# Patient Record
Sex: Female | Born: 1955 | Race: White | Hispanic: No | Marital: Married | Smoking: Former smoker
Health system: Southern US, Community
[De-identification: ages and names within clinical notes are randomized; demographics above are authoritative.]

## PROBLEM LIST (undated history)

## (undated) DIAGNOSIS — E039 Hypothyroidism, unspecified: Secondary | ICD-10-CM

## (undated) HISTORY — DX: Hypothyroidism, unspecified: E03.9

## (undated) HISTORY — PX: CYST EXCISION: SHX5701

---

## 2005-06-05 ENCOUNTER — Other Ambulatory Visit: Admission: RE | Admit: 2005-06-05 | Discharge: 2005-06-05 | Payer: Self-pay | Admitting: Obstetrics and Gynecology

## 2005-07-08 ENCOUNTER — Other Ambulatory Visit: Admission: RE | Admit: 2005-07-08 | Discharge: 2005-07-08 | Payer: Self-pay | Admitting: Obstetrics and Gynecology

## 2005-07-10 ENCOUNTER — Encounter: Admission: RE | Admit: 2005-07-10 | Discharge: 2005-07-10 | Payer: Self-pay | Admitting: Obstetrics and Gynecology

## 2006-08-03 ENCOUNTER — Other Ambulatory Visit: Admission: RE | Admit: 2006-08-03 | Discharge: 2006-08-03 | Payer: Self-pay | Admitting: Obstetrics & Gynecology

## 2006-08-11 ENCOUNTER — Encounter: Admission: RE | Admit: 2006-08-11 | Discharge: 2006-08-11 | Payer: Self-pay | Admitting: Obstetrics & Gynecology

## 2007-09-14 ENCOUNTER — Encounter: Admission: RE | Admit: 2007-09-14 | Discharge: 2007-09-14 | Payer: Self-pay | Admitting: Obstetrics & Gynecology

## 2008-01-06 ENCOUNTER — Other Ambulatory Visit: Admission: RE | Admit: 2008-01-06 | Discharge: 2008-01-06 | Payer: Self-pay | Admitting: Obstetrics & Gynecology

## 2010-05-06 ENCOUNTER — Ambulatory Visit: Payer: Self-pay | Admitting: Emergency Medicine

## 2010-05-06 DIAGNOSIS — E039 Hypothyroidism, unspecified: Secondary | ICD-10-CM | POA: Insufficient documentation

## 2010-05-06 DIAGNOSIS — L03019 Cellulitis of unspecified finger: Secondary | ICD-10-CM

## 2010-05-06 DIAGNOSIS — L02519 Cutaneous abscess of unspecified hand: Secondary | ICD-10-CM | POA: Insufficient documentation

## 2010-05-09 ENCOUNTER — Telehealth (INDEPENDENT_AMBULATORY_CARE_PROVIDER_SITE_OTHER): Payer: Self-pay | Admitting: *Deleted

## 2010-08-19 ENCOUNTER — Encounter: Payer: Self-pay | Admitting: Obstetrics & Gynecology

## 2010-08-27 NOTE — Assessment & Plan Note (Signed)
Summary: L index finger puncture x 2 dys rm 5   Vital Signs:  Patient Profile:   55 Years Old Female CC:      L index finger wound x 2 dys Height:     66 inches Weight:      149 pounds O2 Sat:      100 % O2 treatment:    Room Air Temp:     98.0 degrees F oral Pulse rate:   63 / minute Pulse rhythm:   regular Resp:     16 per minute BP sitting:   141 / 85  (left arm) Cuff size:   regular  Vitals Entered By: Areta Haber CMA (May 06, 2010 6:48 PM)                  Current Allergies (reviewed today): ! CODEINE       History of Present Illness Chief Complaint: L index finger wound x 2 dys History of Present Illness: Swollen L index finger.  Cut herself yesterday with clean knife and now has a red swollen, mildly tender L index finger.  Last Rd was 2 yrs ago.  It is getting worse since yesterday so she decided to have it checked out.  Still can move it.  Isolated to finger.  No F/C/N/V or other symptoms.  Not using OTCs or doing anythign else.  Current Problems: FAMILY HISTORY OF COLON CA 1ST DEGREE RELATIVE <60 (ICD-V16.0) HYPOTHYROIDISM (ICD-244.9) CELLULITIS, FINGER (ICD-681.00)   Current Meds BACTRIM DS 800-160 MG TABS (SULFAMETHOXAZOLE-TRIMETHOPRIM) 1 tab by mouth two times a day for 10 days DOXYCYCLINE HYCLATE 100 MG CAPS (DOXYCYCLINE HYCLATE) 1 by mouth two times a day for 10 days LEVOTHYROXINE SODIUM 25 MCG TABS (LEVOTHYROXINE SODIUM) 1 tab by mouth once daily  REVIEW OF SYSTEMS Constitutional Symptoms      Denies fever, chills, night sweats, weight loss, weight gain, and fatigue.  Eyes       Denies change in vision, eye pain, eye discharge, glasses, contact lenses, and eye surgery. Ear/Nose/Throat/Mouth       Denies hearing loss/aids, change in hearing, ear pain, ear discharge, dizziness, frequent runny nose, frequent nose bleeds, sinus problems, sore throat, hoarseness, and tooth pain or bleeding.  Respiratory       Denies dry cough, productive  cough, wheezing, shortness of breath, asthma, bronchitis, and emphysema/COPD.  Cardiovascular       Denies murmurs, chest pain, and tires easily with exhertion.    Gastrointestinal       Denies stomach pain, nausea/vomiting, diarrhea, constipation, blood in bowel movements, and indigestion. Genitourniary       Denies painful urination, kidney stones, and loss of urinary control. Neurological       Denies paralysis, seizures, and fainting/blackouts. Musculoskeletal       Denies muscle pain, joint pain, joint stiffness, decreased range of motion, redness, swelling, muscle weakness, and gout.  Skin       Denies bruising, unusual mles/lumps or sores, and hair/skin or nail changes.  Psych       Denies mood changes, temper/anger issues, anxiety/stress, speech problems, depression, and sleep problems. Other Comments: Pt states she poked herself with a knife into her L index finger x 2 dys. Pt states she is up to date with her Tdap.   Past History:  Past Medical History: Hypothyroidism  Past Surgical History: Denies surgical history  Family History: Family History of Colon CA 1st degree relative <60 Family History Hypertension  Social History: Married Never  Smoked Alcohol use-no Drug use-no Regular exercise-yes Smoking Status:  never Drug Use:  no Does Patient Exercise:  yes Physical Exam General appearance: well developed, well nourished, no acute distress Heart: normal cap refill Neurological: normal Skin: see below MSE: oriented to time, place, and person L index finger with erythema and swelling most of digit.  DIP, PIP, and MCP all function normally.  No tenderness.  Very small 2mm laceration where she stabbed it with knife.  Erythema and swelling doesn't extend into hand. Assessment New Problems: FAMILY HISTORY OF COLON CA 1ST DEGREE RELATIVE <60 (ICD-V16.0) HYPOTHYROIDISM (ICD-244.9) CELLULITIS, FINGER (ICD-681.00)   Plan New Medications/Changes: DOXYCYCLINE  HYCLATE 100 MG CAPS (DOXYCYCLINE HYCLATE) 1 by mouth two times a day for 10 days  #20 x 0, 05/06/2010, Hoyt Koch MD BACTRIM DS 800-160 MG TABS (SULFAMETHOXAZOLE-TRIMETHOPRIM) 1 tab by mouth two times a day for 10 days  #20 x 0, 05/06/2010, Hoyt Koch MD  New Orders: New Patient Level II [phone] Planning Comments:   Take antibiotics as instructed Ice and elevate as much as possible If worsening pain or redness over the next 2-3 days rather than geting better, will need a referral to ortho/hand in case there is a closed-space infection   The patient and/or caregiver has been counseled thoroughly with regard to medications prescribed including dosage, schedule, interactions, rationale for use, and possible side effects and they verbalize understanding.  Diagnoses and expected course of recovery discussed and will return if not improved as expected or if the condition worsens. Patient and/or caregiver verbalized understanding.  Prescriptions: DOXYCYCLINE HYCLATE 100 MG CAPS (DOXYCYCLINE HYCLATE) 1 by mouth two times a day for 10 days  #20 x 0   Entered and Authorized by:   Hoyt Koch MD   Signed by:   Hoyt Koch MD on 05/06/2010   Method used:   Print then Give to Patient   RxID:   [id number] BACTRIM DS 800-160 MG TABS (SULFAMETHOXAZOLE-TRIMETHOPRIM) 1 tab by mouth two times a day for 10 days  #20 x 0   Entered and Authorized by:   Hoyt Koch MD   Signed by:   Hoyt Koch MD on 05/06/2010   Method used:   Print then Give to Patient   RxID:   [id number]   Orders Added: 1)  New Patient Level II [03500]

## 2010-08-27 NOTE — Progress Notes (Signed)
  Phone Note Outgoing Call Call back at [facility] Phone [phone]   Call placed by: Lajean Saver RN,  May 09, 2010 12:32 PM Call placed to: Patient Summary of Call: Call back: No answer. Message left on patient's personal cell phone with reason for call and call back if her finger is not any better. We will refer her to ortho.

## 2010-10-01 ENCOUNTER — Other Ambulatory Visit: Payer: Self-pay | Admitting: Obstetrics & Gynecology

## 2010-10-01 DIAGNOSIS — Z1231 Encounter for screening mammogram for malignant neoplasm of breast: Secondary | ICD-10-CM

## 2010-10-22 ENCOUNTER — Ambulatory Visit: Payer: Self-pay

## 2014-02-01 ENCOUNTER — Encounter: Payer: Self-pay | Admitting: Gynecology

## 2014-04-04 ENCOUNTER — Encounter: Payer: Self-pay | Admitting: Gynecology

## 2014-04-04 ENCOUNTER — Telehealth: Payer: Self-pay | Admitting: Gynecology

## 2014-04-04 NOTE — Telephone Encounter (Signed)
Left message regarding appointment needs to be rescheduled.

## 2014-04-10 ENCOUNTER — Encounter: Payer: Self-pay | Admitting: Gynecology

## 2014-04-11 ENCOUNTER — Encounter: Payer: Self-pay | Admitting: Gynecology

## 2014-05-02 ENCOUNTER — Encounter: Payer: Self-pay | Admitting: Gynecology

## 2014-05-12 ENCOUNTER — Encounter: Payer: Self-pay | Admitting: Obstetrics & Gynecology

## 2014-05-15 ENCOUNTER — Ambulatory Visit (INDEPENDENT_AMBULATORY_CARE_PROVIDER_SITE_OTHER): Payer: 59 | Admitting: Gynecology

## 2014-05-15 ENCOUNTER — Encounter: Payer: Self-pay | Admitting: Gynecology

## 2014-05-15 VITALS — BP 100/70 | HR 72 | Resp 16 | Ht 64.5 in | Wt 149.0 lb

## 2014-05-15 DIAGNOSIS — Z124 Encounter for screening for malignant neoplasm of cervix: Secondary | ICD-10-CM

## 2014-05-15 DIAGNOSIS — Z01419 Encounter for gynecological examination (general) (routine) without abnormal findings: Secondary | ICD-10-CM

## 2014-05-15 NOTE — Progress Notes (Signed)
58 y.o. Married Caucasian female   [id number] here for annual exam. Pt reports menses are absent due to Menopause. She occasionally report hot flashes, occasionally have night sweats, does not have vaginal dryness.  She is not using lubricants.  She does not report post-menopasual bleeding.  No dyspareunia.    Patient's last menstrual period was 07/28/2009.          Sexually active: Yes.    The current method of family planning is menopasue Exercising: Yes.    yoga, walking Last pap: 10/01/10 Neg Abnormal PAP:  no Mammogram: 09/17/2007 Bi-Rads 1 BSE: no  Colonoscopy: 2007 per patient  DEXA: never had one  Alcohol: 1-2 drinks/month  Tobacco: no  Labs: Juline Patchichard Pang, MD  Health Maintenance  Topic Date Due  . Pap Smear  04/19/1974  . Tetanus/tdap  04/20/1975  . Colonoscopy  04/19/2006  . Mammogram  09/13/2009  . Influenza Vaccine  02/25/2014    Family History  Problem Relation Age of Onset  . Colon cancer Father   . Hypertension Mother     Patient Active Problem List   Diagnosis Date Noted  . HYPOTHYROIDISM 05/06/2010  . CELLULITIS, FINGER 05/06/2010    Past Medical History  Diagnosis Date  . Hypothyroidism     Past Surgical History  Procedure Laterality Date  . Cyst excision  20 years ago     Under arm     Allergies: Codeine  Current Outpatient Prescriptions  Medication Sig Dispense Refill  . Cholecalciferol (VITAMIN D) 2000 UNITS CAPS Take by mouth.      . levothyroxine (SYNTHROID, LEVOTHROID) 25 MCG tablet Take 25 mcg by mouth daily before breakfast.       No current facility-administered medications for this visit.    ROS: Pertinent items are noted in HPI.  Exam:    BP 100/70  Pulse 72  Resp 16  Ht 5' 4.5" (1.638 m)  Wt 149 lb (67.586 kg)  BMI 25.19 kg/m2  LMP 07/28/2009 Weight change: @WEIGHTCHANGE @ Last 3 height recordings:  Ht Readings from Last 3 Encounters:  05/15/14 5' 4.5" (1.638 m)  05/06/10 5\' 6"  (1.676 m)   General appearance: alert,  cooperative and appears stated age Head: Normocephalic, without obvious abnormality, atraumatic Neck: no adenopathy, no carotid bruit, no JVD, supple, symmetrical, trachea midline and thyroid not enlarged, symmetric, no tenderness/mass/nodules Lungs: clear to auscultation bilaterally Breasts: Inspection negative, No nipple retraction or dimpling, No nipple discharge or bleeding, No axillary or supraclavicular adenopathy Heart: regular rate and rhythm, S1, S2 normal, no murmur, click, rub or gallop Abdomen: soft, non-tender; bowel sounds normal; no masses,  no organomegaly Extremities: extremities normal, atraumatic, no cyanosis or edema Skin: Skin color, texture, turgor normal. No rashes or lesions Lymph nodes: Cervical, supraclavicular, and axillary nodes normal. no inguinal nodes palpated Neurologic: Grossly normal   Pelvic: External genitalia:  no lesions              Urethra: normal appearing urethra with no masses, tenderness or lesions              Bartholins and Skenes: Bartholin's, Urethra, Skene's normal                 Vagina: normal appearing vagina with normal color and discharge, no lesions              Cervix: normal appearance and nabothian cysts              Pap taken: Yes.  Bimanual Exam:  Uterus:  uterus is normal size, shape, consistency and nontender                                      Adnexa:    normal adnexa in size, nontender and no masses                                      Rectovaginal: Confirms                                      Anus:  normal sphincter tone, no lesions     1. Encounter for routine gynecological examination  mammogram pap smear counseled on breast self exam, mammography screening, menopause, osteoporosis, adequate intake of calcium and vitamin D, diet and exercise return annually or prn Discussed PAP guideline changes, importance of weight bearing exercises, calcium, vit D and balanced diet.  2. Screening for cervical  cancer Guidelines reviewd - Pap Test with HP (IPS)  An After Visit Summary was printed and given to the patient.

## 2014-05-17 LAB — IPS PAP TEST WITH HPV

## 2014-05-29 ENCOUNTER — Encounter: Payer: Self-pay | Admitting: Gynecology

## 2015-04-26 ENCOUNTER — Other Ambulatory Visit: Payer: Self-pay | Admitting: Physical Medicine and Rehabilitation

## 2015-04-26 DIAGNOSIS — Z1239 Encounter for other screening for malignant neoplasm of breast: Secondary | ICD-10-CM

## 2015-05-09 ENCOUNTER — Ambulatory Visit: Payer: Self-pay

## 2015-05-17 ENCOUNTER — Ambulatory Visit (INDEPENDENT_AMBULATORY_CARE_PROVIDER_SITE_OTHER): Payer: 59

## 2015-05-17 DIAGNOSIS — Z1239 Encounter for other screening for malignant neoplasm of breast: Secondary | ICD-10-CM

## 2015-05-17 DIAGNOSIS — Z1231 Encounter for screening mammogram for malignant neoplasm of breast: Secondary | ICD-10-CM

## 2015-08-23 ENCOUNTER — Encounter: Payer: Self-pay | Admitting: Obstetrics & Gynecology

## 2015-08-23 ENCOUNTER — Ambulatory Visit (INDEPENDENT_AMBULATORY_CARE_PROVIDER_SITE_OTHER): Payer: 59 | Admitting: Obstetrics & Gynecology

## 2015-08-23 VITALS — BP 120/78 | HR 76 | Resp 14 | Ht 64.5 in | Wt 147.0 lb

## 2015-08-23 DIAGNOSIS — Z124 Encounter for screening for malignant neoplasm of cervix: Secondary | ICD-10-CM | POA: Diagnosis not present

## 2015-08-23 DIAGNOSIS — Z01419 Encounter for gynecological examination (general) (routine) without abnormal findings: Secondary | ICD-10-CM

## 2015-08-23 DIAGNOSIS — Z800 Family history of malignant neoplasm of digestive organs: Secondary | ICD-10-CM

## 2015-08-23 DIAGNOSIS — E2839 Other primary ovarian failure: Secondary | ICD-10-CM

## 2015-08-23 NOTE — Progress Notes (Signed)
60 y.o. [id number] MarriedCaucasianF here for annual exam.  Doing well.  No vaginal bleeding.    PCP:  Dr. Selena Batten.    Patient's last menstrual period was 07/28/2009.          Sexually active: Yes.    The current method of family planning is post menopausal status.    Exercising: Yes.    Isogenics Smoker:  no  Health Maintenance: Pap:  05/15/14 Neg. HR HPV:neg  History of abnormal Pap:  no MMG:  05/18/15 BIRADS1:Neg Colonoscopy:  2007 normal - repeat 5 years. Family hx.  BMD:   Never TDaP:  w/ PCP Screening Labs: PCP, Hb today: PCP, Urine today: PCP   reports that she quit smoking about 32 years ago. She has never used smokeless tobacco. She reports that she drinks alcohol. She reports that she does not use illicit drugs.  Past Medical History  Diagnosis Date  . Hypothyroidism     Past Surgical History  Procedure Laterality Date  . Cyst excision  20 years ago     Under arm     Current Outpatient Prescriptions  Medication Sig Dispense Refill  . Cholecalciferol (VITAMIN D) 2000 UNITS CAPS Take by mouth.    . levothyroxine (SYNTHROID, LEVOTHROID) 25 MCG tablet Take 25 mcg by mouth daily before breakfast.     No current facility-administered medications for this visit.    Family History  Problem Relation Age of Onset  . Colon cancer Father   . Hypertension Mother   . Bell's palsy Mother     ROS:  Pertinent items are noted in HPI.  Otherwise, a comprehensive ROS was negative.  Exam:   BP 120/78 mmHg  Pulse 76  Resp 14  Ht 5' 4.5" (1.638 m)  Wt 147 lb (66.679 kg)  BMI 24.85 kg/m2  LMP 07/28/2009  Weight change: +2#  Height: 5' 4.5" (163.8 cm)  Ht Readings from Last 3 Encounters:  08/23/15 5' 4.5" (1.638 m)  05/15/14 5' 4.5" (1.638 m)  05/06/10  (1.676 m)    General appearance: alert, cooperative and appears stated age Head: Normocephalic, without obvious abnormality, atraumatic Neck: no adenopathy, supple, symmetrical, trachea midline and thyroid normal to  inspection and palpation Lungs: clear to auscultation bilaterally Breasts: normal appearance, no masses or tenderness Heart: regular rate and rhythm Abdomen: soft, non-tender; bowel sounds normal; no masses,  no organomegaly Extremities: extremities normal, atraumatic, no cyanosis or edema Skin: Skin color, texture, turgor normal. No rashes or lesions Lymph nodes: Cervical, supraclavicular, and axillary nodes normal. No abnormal inguinal nodes palpated Neurologic: Grossly normal   Pelvic: External genitalia:  no lesions              Urethra:  normal appearing urethra with no masses, tenderness or lesions              Bartholins and Skenes: normal                 Vagina: normal appearing vagina with normal color and discharge, no lesions              Cervix: no lesions              Pap taken: Yes.   Bimanual Exam:  Uterus:  normal size, contour, position, consistency, mobility, non-tender              Adnexa: normal adnexa and no mass, fullness, tenderness               Rectovaginal:  Confirms               Anus:  normal sphincter tone, no lesions  Chaperone was present for exam.  A:  Well Woman with normal exam PMP, no HRT Family hx of colon cancer in her father Hypothyroidism  P:   Mammogram yearly pap smear today.  Neg pap with neg  HR HPV 10/15 Referral for colonoscopy Labs and vaccines with Dr. Selena Batten return annually or prn

## 2015-08-23 NOTE — Patient Instructions (Signed)
Please schedule your bone density with your mammogram in October.

## 2015-08-24 LAB — IPS PAP TEST WITH REFLEX TO HPV

## 2015-09-06 ENCOUNTER — Telehealth: Payer: Self-pay | Admitting: Obstetrics & Gynecology

## 2015-09-06 NOTE — Telephone Encounter (Signed)
Spoke with patient regarding referral to Dr Deniece Portela in [facility], this office had been trying to reach the patient to schedule.  I provided patient with Dr Tiburcio Pea' phone number. Patient agreeable to call and schedule appointment

## 2015-10-08 DIAGNOSIS — K635 Polyp of colon: Secondary | ICD-10-CM | POA: Insufficient documentation

## 2016-02-26 ENCOUNTER — Telehealth: Payer: Self-pay | Admitting: Obstetrics & Gynecology

## 2016-02-26 NOTE — Telephone Encounter (Signed)
Patient's sister was just diagnosed  with breast cancer and is wondering what this might mean for her. Patient said her father had two relatives with breast cancer also.

## 2016-02-27 NOTE — Telephone Encounter (Signed)
Routing to Dr. Hyacinth Meeker to review and advise. Office visit to discuss? Patient last screening mammogram 04/2015-Cat B Breast Density.

## 2016-02-27 NOTE — Telephone Encounter (Signed)
OV is a good idea to discuss genetic testing.  It will be helpful if she has details of sister's diagnosis.  Type of cancer, hormone positive/negative testing, her 2 nu testing?  Thanks.

## 2016-02-28 NOTE — Telephone Encounter (Signed)
Call to patient.  She requests office visit in 03/2016.  She will gather information and call back with any concerns.   Routing to provider for final review. Patient agreeable to disposition. Will close encounter.

## 2016-04-11 ENCOUNTER — Ambulatory Visit (INDEPENDENT_AMBULATORY_CARE_PROVIDER_SITE_OTHER): Payer: 59 | Admitting: Obstetrics & Gynecology

## 2016-04-11 ENCOUNTER — Encounter: Payer: Self-pay | Admitting: Obstetrics & Gynecology

## 2016-04-11 DIAGNOSIS — Z803 Family history of malignant neoplasm of breast: Secondary | ICD-10-CM | POA: Insufficient documentation

## 2016-04-11 NOTE — Progress Notes (Signed)
GYNECOLOGY  VISIT   HPI: 60 y.o. [id number] Married Caucasian female with strong family hx of breast cancer.  Sister just diagnosed with breast cancer.  She reports her sister is not very forthcoming with information so pt has had to glean a lot of the information about this in bits and pieces.  Sister did not Pt's sister is in her 6470's.  She also has paternal aunts that were both PMP.  Her father was diagnosed with colon cancer in his 5470's.  No melanoma hx.  No pancreatic hx and no endometrial hx.    D/W pt genetic testing typically done through [facility] and with medical genetics via appt.  D/w pt implications of test results including possible increased screening with yearly MRI, possible surgery including prophylactic mastectomy, possible prophylactic medication treatment as well.  Understands genetics testing may have implications for ovary removal as well and she may benefit from seeing oncologist as well.  She voices clear understanding and desires "to think about this".    Pt and I discussed 3D MMG with future mammography.  Questions answered.  Pt will plan to proceed with this in October.  Patient Active Problem List   Diagnosis Date Noted  . HYPOTHYROIDISM 05/06/2010  . CELLULITIS, FINGER 05/06/2010    Past Medical History:  Diagnosis Date  . Hypothyroidism     Past Surgical History:  Procedure Laterality Date  . CYST EXCISION  20 years ago    Under arm    MEDS:  Reviewed in EPIC and UTD  ALLERGIES: Codeine  SH:  Married, non smoker  Review of Systems  All other systems reviewed and are negative.   PHYSICAL EXAMINATION:    BP 110/70 (BP Location: Right Arm, Patient Position: Sitting, Cuff Size: Normal)   Pulse 80   Resp 14   Ht 5' 4.5" (1.638 m)   Wt 146 lb 6.4 oz (66.4 kg)   LMP 07/28/2009   BMI 24.74 kg/m     General appearance: alert, cooperative and appears stated age   Assessment: Family hx of breast cancer  Plan: Pt will do 3D MMG  with next mammography Pt is considering genetic testing and will call if desires to proceed.  Will need genetic counseling consultation at that time.   ~20 minutes spent with patient >50% of time was in face to face discussion of above.

## 2016-04-22 ENCOUNTER — Emergency Department (INDEPENDENT_AMBULATORY_CARE_PROVIDER_SITE_OTHER)
Admission: EM | Admit: 2016-04-22 | Discharge: 2016-04-22 | Disposition: A | Discharge status: Home / Self Care | Payer: 59 | Source: Home / Self Care | Attending: Family Medicine | Admitting: Family Medicine

## 2016-04-22 DIAGNOSIS — J069 Acute upper respiratory infection, unspecified: Secondary | ICD-10-CM

## 2016-04-22 MED ORDER — AZITHROMYCIN 250 MG PO TABS: ORAL_TABLET | ORAL | 0 refills | Status: DC

## 2016-04-22 MED ORDER — PREDNISONE 20 MG PO TABS: ORAL_TABLET | ORAL | 0 refills | Status: DC

## 2016-04-22 NOTE — Discharge Instructions (Signed)
Take plain guaifenesin (1200mg extended release tabs such as Mucinex) twice daily, with plenty of water, for cough and congestion.  May add Pseudoephedrine (30mg, one or two every 4 to 6 hours) for sinus congestion.  Get adequate rest.   °May use Afrin nasal spray (or generic oxymetazoline) twice daily for about 5 days and then discontinue.  Also recommend using saline nasal spray several times daily and saline nasal irrigation (AYR is a common brand).  Use Flonase nasal spray each morning after using Afrin nasal spray and saline nasal irrigation. °Try warm salt water gargles for sore throat.  °Stop all antihistamines for now, and other non-prescription cough/cold preparations. °May take Delsym Cough Suppressant at bedtime for nighttime cough.  °Begin Azithromycin if not improving about one week or if persistent fever develops    °Follow-up with family doctor if not improving about10 days.  °

## 2016-04-22 NOTE — ED Triage Notes (Signed)
Pt started with hoarseness about a week ago.  Wednesday- Friday of last week had a sore throat which is now resolved.  This morning started with left sided facial pain, and coughing up yellowish/green mucous.

## 2016-04-22 NOTE — ED Provider Notes (Signed)
[facility]    CSN: [id number] Arrival date & time: 04/22/16  1714     History   Chief Complaint Chief Complaint  Patient presents with  . Facial Pain    left sinus    HPI Jodi Hodge is a 60 y.o. female.   About 6 days ago patient developed typical cold-like symptoms developing over several days,  including mild sore throat, sinus congestion, hoarseness, fatigue, and cough.  During the past two days she has had increased left face sinus pressure.  She has a history of seasonal rhinitis.   The history is provided by the patient.    Past Medical History:  Diagnosis Date  . Hypothyroidism     Patient Active Problem List   Diagnosis Date Noted  . Family history of malignant neoplasm of breast 04/11/2016  . HYPOTHYROIDISM 05/06/2010  . CELLULITIS, FINGER 05/06/2010    Past Surgical History:  Procedure Laterality Date  . CYST EXCISION  20 years ago    Under arm     OB History    Gravida Para Term Preterm AB Living   2 2 2     2    SAB TAB Ectopic Multiple Live Births           2       Home Medications    Prior to Admission medications   Medication Sig Start Date End Date Taking? Authorizing Provider  azithromycin (ZITHROMAX Z-PAK) 250 MG tablet Take 2 tabs today; then begin one tab once daily for 4 more days. (Rx void after 04/30/16) 04/22/16   Lattie Haw, MD  Cholecalciferol (VITAMIN D) 2000 UNITS CAPS Take by mouth.    Historical Provider, MD  levothyroxine (SYNTHROID, LEVOTHROID) 25 MCG tablet Take 25 mcg by mouth daily before breakfast.    Historical Provider, MD  predniSONE (DELTASONE) 20 MG tablet Take one tab by mouth twice daily for 5 days, then one daily. Take with food. 04/22/16   Lattie Haw, MD    Family History Family History  Problem Relation Age of Onset  . Colon cancer Father   . Hypertension Mother   . Bell's palsy Mother   . Breast cancer Sister   . Breast cancer Maternal Aunt   . Breast cancer Other     Social  History Social History  Substance Use Topics  . Smoking status: Former Smoker    Quit date: 07/29/1983  . Smokeless tobacco: Never Used  . Alcohol use Yes     Comment: 1-2 drinks/month     Allergies   Codeine   Review of Systems Review of Systems + sore throat + hoarse + cough No pleuritic pain No wheezing + nasal congestion + post-nasal drainage + sinus pain/pressure No itchy/red eyes No earache No hemoptysis No SOB No fever, + chills/sweats No nausea No vomiting No abdominal pain No diarrhea No urinary symptoms No skin rash + fatigue No myalgias No headache    Physical Exam Triage Vital Signs ED Triage Vitals  Enc Vitals Group     BP 04/22/16 1730 147/81     Pulse Rate 04/22/16 1730 79     Resp --      Temp 04/22/16 1730 98.3 F (36.8 C)     Temp Source 04/22/16 1730 Oral     SpO2 04/22/16 1730 100 %     Weight 04/22/16 1731 149 lb (67.6 kg)     Height 04/22/16 1731 5\' 5"  (1.651 m)     Head Circumference --  Peak Flow --      Pain Score 04/22/16 1732 0     Pain Loc --      Pain Edu? --      Excl. in GC? --    No data found.   Updated Vital Signs BP 147/81 (BP Location: Left Arm)   Pulse 79   Temp 98.3 F (36.8 C) (Oral)   Ht 5\' 5"  (1.651 m)   Wt 149 lb (67.6 kg)   LMP 07/28/2009   SpO2 100%   BMI 24.79 kg/m   Visual Acuity Right Eye Distance:   Left Eye Distance:   Bilateral Distance:    Right Eye Near:   Left Eye Near:    Bilateral Near:     Physical Exam Nursing notes and Vital Signs reviewed. Appearance:  Patient appears stated age, and in no acute distress Eyes:  Pupils are equal, round, and reactive to light and accomodation.  Extraocular movement is intact.  Conjunctivae are not inflamed  Ears:  Canals normal.  Tympanic membranes normal.  Nose:  Mildly congested turbinates.  No sinus tenderness.   Pharynx:  Normal Neck:  Supple.  Tender enlarged posterior/lateral nodes are palpated bilaterally  Lungs:  Clear to  auscultation.  Breath sounds are equal.  Moving air well. Heart:  Regular rate and rhythm without murmurs, rubs, or gallops.  Abdomen:  Nontender without masses or hepatosplenomegaly.  Bowel sounds are present.  No CVA or flank tenderness.  Extremities:  No edema.  Skin:  No rash present.    UC Treatments / Results  Labs (all labs ordered are listed, but only abnormal results are displayed) Labs Reviewed - No data to display  EKG  EKG Interpretation None       Radiology No results found.  Procedures Procedures (including critical care time)  Medications Ordered in UC Medications - No data to display   Initial Impression / Assessment and Plan / UC Course  I have reviewed the triage vital signs and the nursing notes.  Pertinent labs & imaging results that were available during my care of the patient were reviewed by me and considered in my medical decision making (see chart for details).  Clinical Course  There is no evidence of bacterial infection today.   Begin prednisone burst/taper. Take plain guaifenesin (1200mg  extended release tabs such as Mucinex) twice daily, with plenty of water, for cough and congestion.  May add Pseudoephedrine (30mg , one or two every 4 to 6 hours) for sinus congestion.  Get adequate rest.   May use Afrin nasal spray (or generic oxymetazoline) twice daily for about 5 days and then discontinue.  Also recommend using saline nasal spray several times daily and saline nasal irrigation (AYR is a common brand).  Use Flonase nasal spray each morning after using Afrin nasal spray and saline nasal irrigation. Try warm salt water gargles for sore throat.  Stop all antihistamines for now, and other non-prescription cough/cold preparations. May take Delsym Cough Suppressant at bedtime for nighttime cough.  Begin Azithromycin if not improving about one week or if persistent fever develops (Given a prescription to hold, with an expiration date)  Follow-up with  family doctor if not improving about10 days.      Final Clinical Impressions(s) / UC Diagnoses   Final diagnoses:  Viral URI    New Prescriptions New Prescriptions   AZITHROMYCIN (ZITHROMAX Z-PAK) 250 MG TABLET    Take 2 tabs today; then begin one tab once daily for 4 more days. (  Rx void after 04/30/16)   PREDNISONE (DELTASONE) 20 MG TABLET    Take one tab by mouth twice daily for 5 days, then one daily. Take with food.     Lattie Haw, MD 04/22/16 [id number]

## 2016-05-28 ENCOUNTER — Other Ambulatory Visit: Payer: Self-pay | Admitting: Obstetrics & Gynecology

## 2016-05-28 DIAGNOSIS — Z1231 Encounter for screening mammogram for malignant neoplasm of breast: Secondary | ICD-10-CM

## 2016-06-03 ENCOUNTER — Ambulatory Visit (HOSPITAL_BASED_OUTPATIENT_CLINIC_OR_DEPARTMENT_OTHER)
Admission: RE | Admit: 2016-06-03 | Discharge: 2016-06-03 | Disposition: A | Discharge status: Home / Self Care | Payer: 59 | Source: Ambulatory Visit | Attending: Obstetrics & Gynecology | Admitting: Obstetrics & Gynecology

## 2016-06-03 DIAGNOSIS — Z1231 Encounter for screening mammogram for malignant neoplasm of breast: Secondary | ICD-10-CM | POA: Diagnosis present

## 2016-08-08 ENCOUNTER — Ambulatory Visit: Payer: Self-pay | Admitting: Obstetrics & Gynecology

## 2016-09-11 ENCOUNTER — Encounter: Payer: Self-pay | Admitting: Obstetrics & Gynecology

## 2016-09-11 ENCOUNTER — Ambulatory Visit (INDEPENDENT_AMBULATORY_CARE_PROVIDER_SITE_OTHER): Payer: 59 | Admitting: Obstetrics & Gynecology

## 2016-09-11 VITALS — BP 112/58 | HR 76 | Resp 14 | Ht 65.25 in | Wt 147.6 lb

## 2016-09-11 DIAGNOSIS — Z01419 Encounter for gynecological examination (general) (routine) without abnormal findings: Secondary | ICD-10-CM | POA: Diagnosis not present

## 2016-09-11 DIAGNOSIS — Z205 Contact with and (suspected) exposure to viral hepatitis: Secondary | ICD-10-CM | POA: Diagnosis not present

## 2016-09-11 NOTE — Progress Notes (Signed)
61 y.o. [id number] Married Caucasian F here for annual exam.    Patient's last menstrual period was 07/28/2009.          Sexually active: Yes.    The current method of family planning is post menopausal status.    Exercising: Yes.    stretching, cardio, strength  Smoker:  Former smoker  Health Maintenance: Pap: 08/23/15 negative, 05/15/14 negative, HR HPV negative  History of abnormal Pap:  no MMG:  06/03/16 BIRADS 1 negative  Colonoscopy:  10/05/15.  Dr. Tiburcio PeaHarris.  Follow-up five years.   BMD:   never TDaP:  PCP Pneumonia vaccine(s):  never Zostavax:   never Hep C testing: will discuss with PCP  Screening Labs: PCP, Hb today: PCP, Urine today: PCP   reports that she quit smoking about 33 years ago. She has never used smokeless tobacco. She reports that she drinks alcohol. She reports that she does not use drugs.  Past Medical History:  Diagnosis Date  . Hypothyroidism     Past Surgical History:  Procedure Laterality Date  . CYST EXCISION  20 years ago    Under arm     Current Outpatient Prescriptions  Medication Sig Dispense Refill  . Cholecalciferol (VITAMIN D) 2000 UNITS CAPS Take by mouth.    . levothyroxine (SYNTHROID, LEVOTHROID) 25 MCG tablet Take 25 mcg by mouth daily before breakfast.     No current facility-administered medications for this visit.     Family History  Problem Relation Age of Onset  . Colon cancer Father   . Hypertension Mother   . Bell's palsy Mother   . Breast cancer Sister   . Breast cancer Maternal Aunt   . Breast cancer Other     ROS:  Pertinent items are noted in HPI.  Otherwise, a comprehensive ROS was negative.  Exam:   BP (!) 112/58 (BP Location: Right Arm, Patient Position: Sitting, Cuff Size: Normal)   Pulse 76   Resp 14   Ht 5' 5.25" (1.657 m)   Wt 147 lb 9.6 oz (67 kg)   LMP 07/28/2009   BMI 24.37 kg/m   Weight change: stable  Height: 5' 5.25" (165.7 cm)  Ht Readings from Last 3 Encounters:  09/11/16 5' 5.25" (1.657 m)   04/22/16 5\' 5"  (1.651 m)  04/11/16 5' 4.5" (1.638 m)    General appearance: alert, cooperative and appears stated age Head: Normocephalic, without obvious abnormality, atraumatic Neck: no adenopathy, supple, symmetrical, trachea midline and thyroid normal to inspection and palpation Lungs: clear to auscultation bilaterally Breasts: normal appearance, no masses or tenderness Heart: regular rate and rhythm Abdomen: soft, non-tender; bowel sounds normal; no masses,  no organomegaly Extremities: extremities normal, atraumatic, no cyanosis or edema Skin: Skin color, texture, turgor normal. No rashes or lesions Lymph nodes: Cervical, supraclavicular, and axillary nodes normal. No abnormal inguinal nodes palpated Neurologic: Grossly normal  Pelvic: External genitalia:  no lesions              Urethra:  normal appearing urethra with no masses, tenderness or lesions              Bartholins and Skenes: normal                 Vagina: normal appearing vagina with normal color and discharge, no lesions              Cervix: no lesions              Pap taken: Yes.  Bimanual Exam:  Uterus:  normal size, contour, position, consistency, mobility, non-tender              Adnexa: normal adnexa and no mass, fullness, tenderness               Rectovaginal: Confirms               Anus:  normal sphincter tone, no lesions  Chaperone was present for exam.  A:  Well woman with normal exam PMP, no HRT Family hx of colon cancer in father Hypothyroidism  P:   Mammogram guidelines reviewed.  She is UTD. Neg pap with neg HR HPV 2015.  Neg pap 2017.  No pap obtained today Hep C antibody will be obtained today Pt will follow up with Dr. Selena Batten for routine labs Declines singles vaccination Return annually or prn new issues

## 2016-09-12 LAB — HEPATITIS C ANTIBODY: HCV Ab: NEGATIVE

## 2016-12-12 ENCOUNTER — Ambulatory Visit: Payer: 59 | Admitting: Obstetrics & Gynecology

## 2017-12-24 ENCOUNTER — Encounter: Payer: Self-pay | Admitting: Obstetrics & Gynecology

## 2017-12-24 ENCOUNTER — Encounter

## 2017-12-24 ENCOUNTER — Other Ambulatory Visit (HOSPITAL_COMMUNITY)
Admission: RE | Admit: 2017-12-24 | Discharge: 2017-12-24 | Disposition: A | Discharge status: Home / Self Care | Payer: 59 | Source: Ambulatory Visit | Attending: Obstetrics & Gynecology | Admitting: Obstetrics & Gynecology

## 2017-12-24 ENCOUNTER — Other Ambulatory Visit: Payer: Self-pay | Admitting: Obstetrics & Gynecology

## 2017-12-24 ENCOUNTER — Ambulatory Visit (INDEPENDENT_AMBULATORY_CARE_PROVIDER_SITE_OTHER): Payer: 59 | Admitting: Obstetrics & Gynecology

## 2017-12-24 VITALS — BP 130/70 | HR 80 | Resp 16 | Ht 64.75 in | Wt 148.8 lb

## 2017-12-24 DIAGNOSIS — Z01419 Encounter for gynecological examination (general) (routine) without abnormal findings: Secondary | ICD-10-CM | POA: Diagnosis not present

## 2017-12-24 DIAGNOSIS — Z124 Encounter for screening for malignant neoplasm of cervix: Secondary | ICD-10-CM

## 2017-12-24 DIAGNOSIS — Z780 Asymptomatic menopausal state: Secondary | ICD-10-CM

## 2017-12-24 DIAGNOSIS — R3129 Other microscopic hematuria: Secondary | ICD-10-CM | POA: Diagnosis not present

## 2017-12-24 DIAGNOSIS — Z1231 Encounter for screening mammogram for malignant neoplasm of breast: Secondary | ICD-10-CM

## 2017-12-24 NOTE — Progress Notes (Signed)
62 y.o. [id number] MarriedCaucasianF here for annual exam.  Doing well.  Has lab work with Dr. Selena Batten.  Had some blood in her urine with micro.  Has not hematuria that she's noted.  Denies vaginal bleeding.    Patient's last menstrual period was 07/28/2009.          Sexually active: Yes.    The current method of family planning is post menopausal status.    Exercising: Yes.    walking, low impact aerobics, swimming  Smoker:  no  Health Maintenance: Pap:  08/23/15 Neg   05/15/14 Neg. HR HPV:neg  History of abnormal Pap:  no MMG:  06/03/16 BIRADS1:Neg.  Aware this is overdue.   Colonoscopy:  10/05/15 f/u 5 years. Dr. Tiburcio Pea  BMD:   Never TDaP:  PCP Pneumonia vaccine(s):  n/a Shingrix:   No.  Declines at this time.   Hep C testing: 09/11/16 neg  Screening Labs: PCP   reports that she quit smoking about 34 years ago. She has never used smokeless tobacco. She reports that she drinks alcohol. She reports that she does not use drugs.  Past Medical History:  Diagnosis Date  . Hypothyroidism     Past Surgical History:  Procedure Laterality Date  . CYST EXCISION  20 years ago    Under arm     Current Outpatient Medications  Medication Sig Dispense Refill  . Cholecalciferol (VITAMIN D) 2000 UNITS CAPS Take by mouth.    . levothyroxine (SYNTHROID, LEVOTHROID) 25 MCG tablet Take 25 mcg by mouth daily before breakfast.     No current facility-administered medications for this visit.     Family History  Problem Relation Age of Onset  . Colon cancer Father   . Hypertension Mother   . Bell's palsy Mother   . Breast cancer Sister   . Breast cancer Maternal Aunt   . Breast cancer Other     Review of Systems  Genitourinary:       Night urination   All other systems reviewed and are negative.   Exam:   BP 130/70 (BP Location: Right Arm, Patient Position: Sitting, Cuff Size: Normal)   Pulse 80   Resp 16   Ht 5' 4.75" (1.645 m)   Wt 148 lb 12.8 oz (67.5 kg)   LMP 07/28/2009   BMI  24.95 kg/m    Height: 5' 4.75" (164.5 cm)  Ht Readings from Last 3 Encounters:  12/24/17 5' 4.75" (1.645 m)  09/11/16 5' 5.25" (1.657 m)  04/22/16  (1.651 m)    General appearance: alert, cooperative and appears stated age Head: Normocephalic, without obvious abnormality, atraumatic Neck: no adenopathy, supple, symmetrical, trachea midline and thyroid normal to inspection and palpation Lungs: clear to auscultation bilaterally Breasts: normal appearance, no masses or tenderness Heart: regular rate and rhythm Abdomen: soft, non-tender; bowel sounds normal; no masses,  no organomegaly Extremities: extremities normal, atraumatic, no cyanosis or edema Skin: Skin color, texture, turgor normal. No rashes or lesions Lymph nodes: Cervical, supraclavicular, and axillary nodes normal. No abnormal inguinal nodes palpated Neurologic: Grossly normal   Pelvic: External genitalia:  no lesions              Urethra:  normal appearing urethra with no masses, tenderness or lesions              Bartholins and Skenes: normal                 Vagina: normal appearing vagina with normal color and  discharge, no lesions              Cervix: no lesions              Pap taken: Yes.   Bimanual Exam:  Uterus:  normal size, contour, position, consistency, mobility, non-tender              Adnexa: normal adnexa and no mass, fullness, tenderness               Rectovaginal: Confirms               Anus:  normal sphincter tone, no lesions  Chaperone was present for exam.  A:  Well Woman with normal exam PMP, no HRT Family hx of colon cancer in father, having regular follow-up Hypothyroidism H/O microscopic hematuria on urine test 11/18  P:   Mammogram guidelines reviewed.  Pt aware this is overdue. Will do BMD with MMG this year.  Order has been placed. pap smear with HR HPV obtained today Lab work is done with Dr. Selena Batten Repeat urine micro today return annually or prn

## 2017-12-25 LAB — URINALYSIS, MICROSCOPIC ONLY
BACTERIA UA: NONE SEEN
CASTS: NONE SEEN /LPF

## 2017-12-29 LAB — CYTOLOGY - PAP
Diagnosis: NEGATIVE
HPV: NOT DETECTED

## 2018-01-06 ENCOUNTER — Ambulatory Visit (INDEPENDENT_AMBULATORY_CARE_PROVIDER_SITE_OTHER): Payer: 59

## 2018-01-06 DIAGNOSIS — Z1231 Encounter for screening mammogram for malignant neoplasm of breast: Secondary | ICD-10-CM | POA: Diagnosis not present

## 2018-01-06 DIAGNOSIS — Z780 Asymptomatic menopausal state: Secondary | ICD-10-CM | POA: Diagnosis not present

## 2018-06-14 ENCOUNTER — Telehealth: Payer: Self-pay | Admitting: Obstetrics & Gynecology

## 2018-06-14 NOTE — Telephone Encounter (Signed)
Erroneous encounter

## 2019-03-31 ENCOUNTER — Ambulatory Visit: Payer: 59 | Admitting: Obstetrics & Gynecology

## 2019-08-01 ENCOUNTER — Ambulatory Visit: Payer: 59 | Admitting: Obstetrics & Gynecology

## 2020-05-09 ENCOUNTER — Other Ambulatory Visit: Payer: Self-pay | Admitting: Obstetrics & Gynecology

## 2020-05-09 DIAGNOSIS — Z1231 Encounter for screening mammogram for malignant neoplasm of breast: Secondary | ICD-10-CM

## 2020-05-31 ENCOUNTER — Other Ambulatory Visit: Payer: Self-pay

## 2020-05-31 ENCOUNTER — Ambulatory Visit (INDEPENDENT_AMBULATORY_CARE_PROVIDER_SITE_OTHER): Payer: No Typology Code available for payment source

## 2020-05-31 DIAGNOSIS — Z1231 Encounter for screening mammogram for malignant neoplasm of breast: Secondary | ICD-10-CM

## 2021-05-13 ENCOUNTER — Other Ambulatory Visit: Payer: Self-pay | Admitting: Obstetrics & Gynecology

## 2021-05-13 DIAGNOSIS — Z1231 Encounter for screening mammogram for malignant neoplasm of breast: Secondary | ICD-10-CM

## 2021-06-13 ENCOUNTER — Other Ambulatory Visit: Payer: Self-pay

## 2021-06-13 ENCOUNTER — Ambulatory Visit (INDEPENDENT_AMBULATORY_CARE_PROVIDER_SITE_OTHER): Payer: No Typology Code available for payment source

## 2021-06-13 DIAGNOSIS — Z1231 Encounter for screening mammogram for malignant neoplasm of breast: Secondary | ICD-10-CM | POA: Diagnosis not present

## 2022-04-25 ENCOUNTER — Other Ambulatory Visit (HOSPITAL_COMMUNITY): Payer: Self-pay | Admitting: Registered Nurse

## 2022-04-25 ENCOUNTER — Other Ambulatory Visit: Payer: Self-pay | Admitting: Registered Nurse

## 2022-04-25 DIAGNOSIS — E785 Hyperlipidemia, unspecified: Secondary | ICD-10-CM

## 2022-05-12 ENCOUNTER — Ambulatory Visit (HOSPITAL_BASED_OUTPATIENT_CLINIC_OR_DEPARTMENT_OTHER): Payer: Self-pay

## 2022-05-23 ENCOUNTER — Ambulatory Visit (HOSPITAL_BASED_OUTPATIENT_CLINIC_OR_DEPARTMENT_OTHER)
Admission: RE | Admit: 2022-05-23 | Discharge: 2022-05-23 | Disposition: A | Discharge status: Home / Self Care | Payer: Self-pay | Source: Ambulatory Visit | Attending: Registered Nurse | Admitting: Registered Nurse

## 2022-05-23 DIAGNOSIS — E785 Hyperlipidemia, unspecified: Secondary | ICD-10-CM | POA: Insufficient documentation

## 2023-05-10 IMAGING — MG MM DIGITAL SCREENING BILAT W/ TOMO AND CAD
8 series · 8 of 24 positions shown · non-contrast
Comparison: Previous exam(s).

CLINICAL DATA: Screening.

EXAM:
DIGITAL SCREENING BILATERAL MAMMOGRAM WITH TOMOSYNTHESIS AND CAD
TECHNIQUE: Bilateral screening digital craniocaudal and mediolateral oblique
mammograms were obtained. Bilateral screening digital breast
tomosynthesis was performed. The images were evaluated with
computer-aided detection.

[L MLO synth-2D]
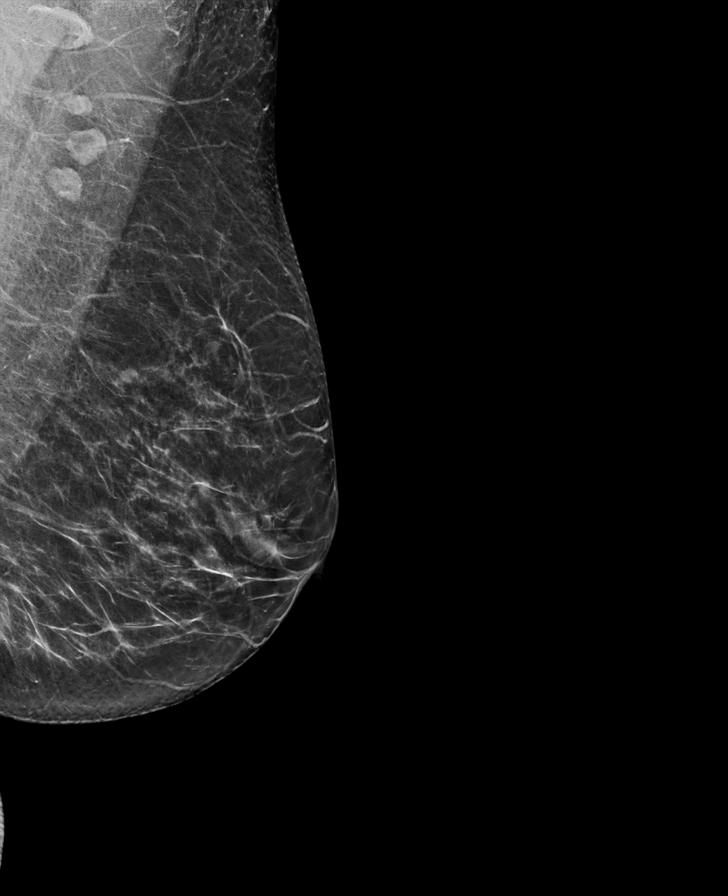

[R CC synth-2D]
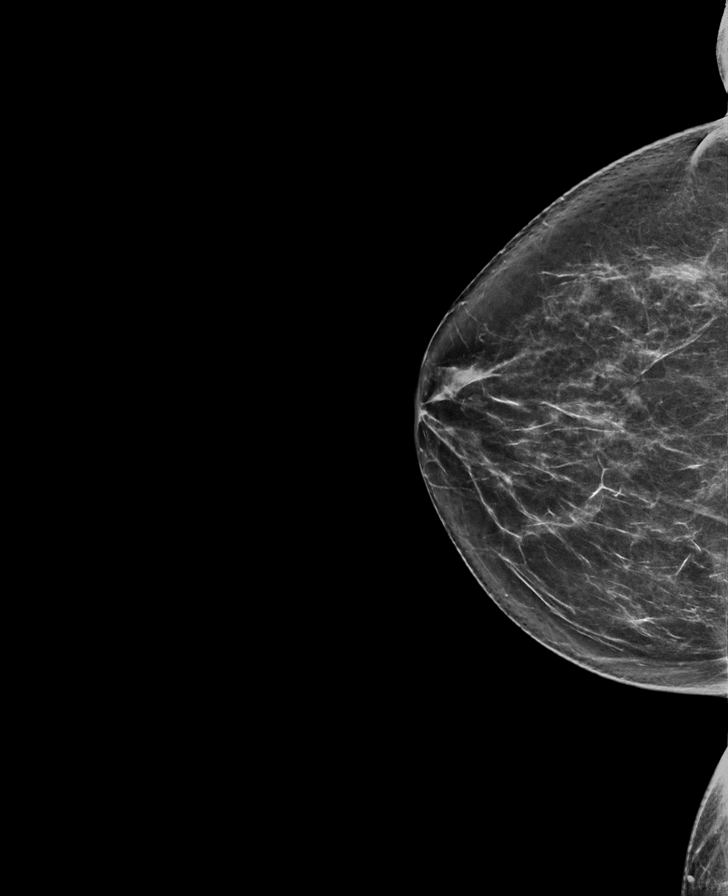

[R MLO synth-2D]
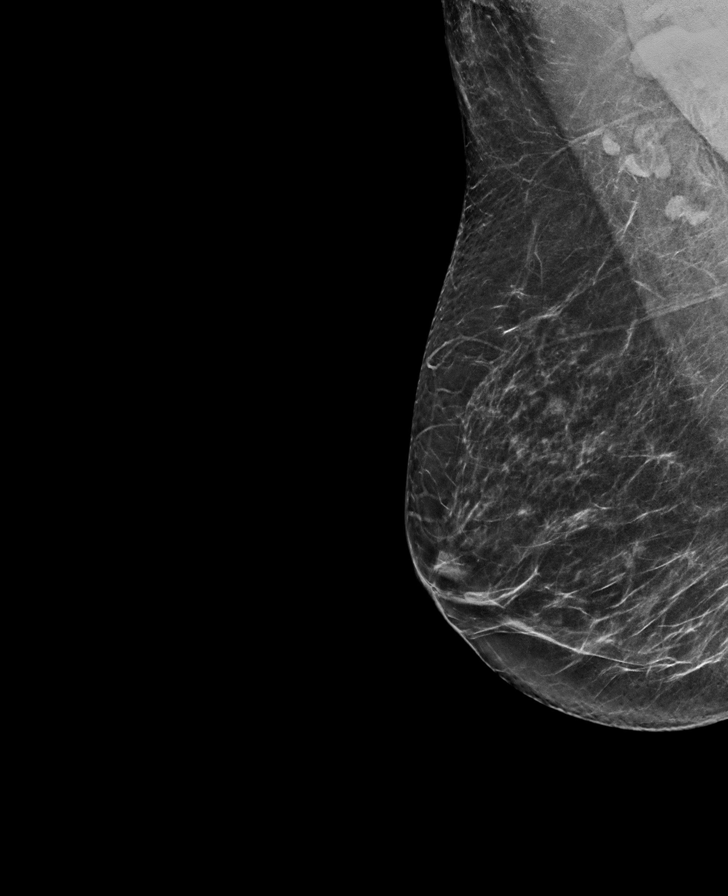

[L CC synth-2D]
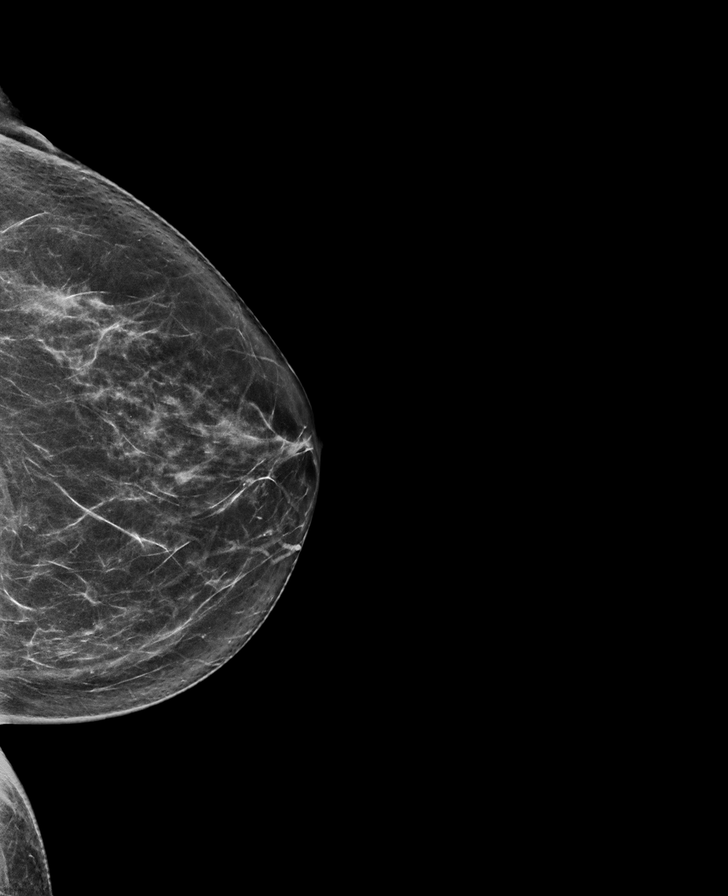

[R CC tomo · tomo slice 37/72.0]
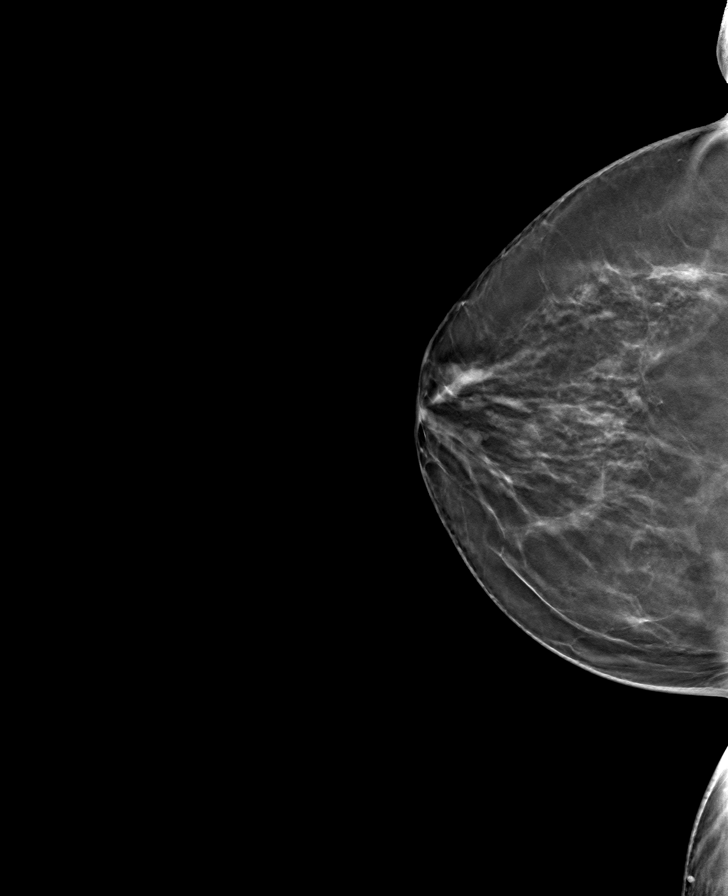

[R MLO tomo · tomo slice 35/70.0]
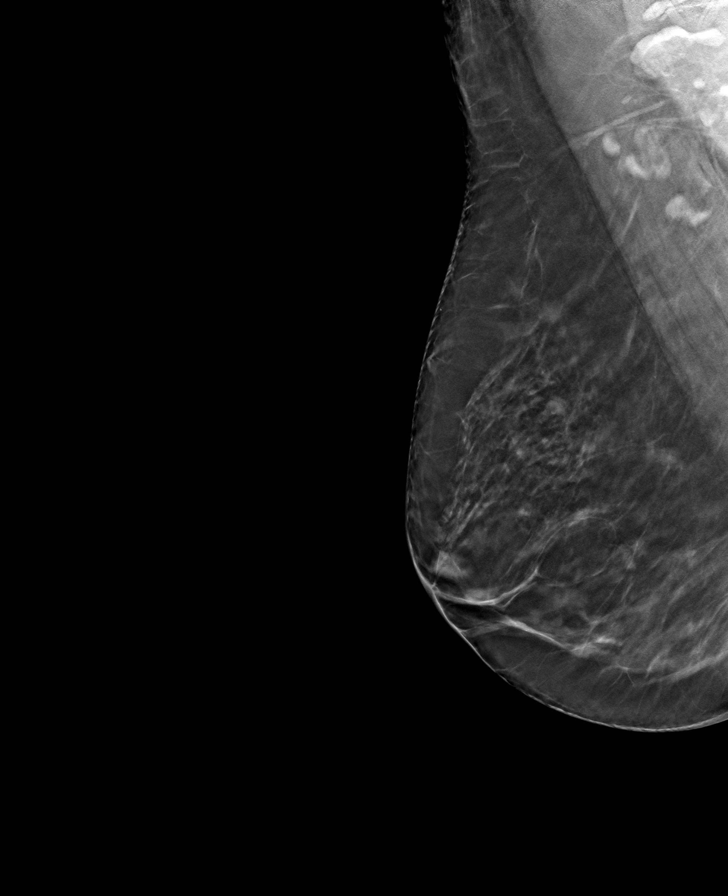

[L MLO tomo · tomo slice 35/69.0]
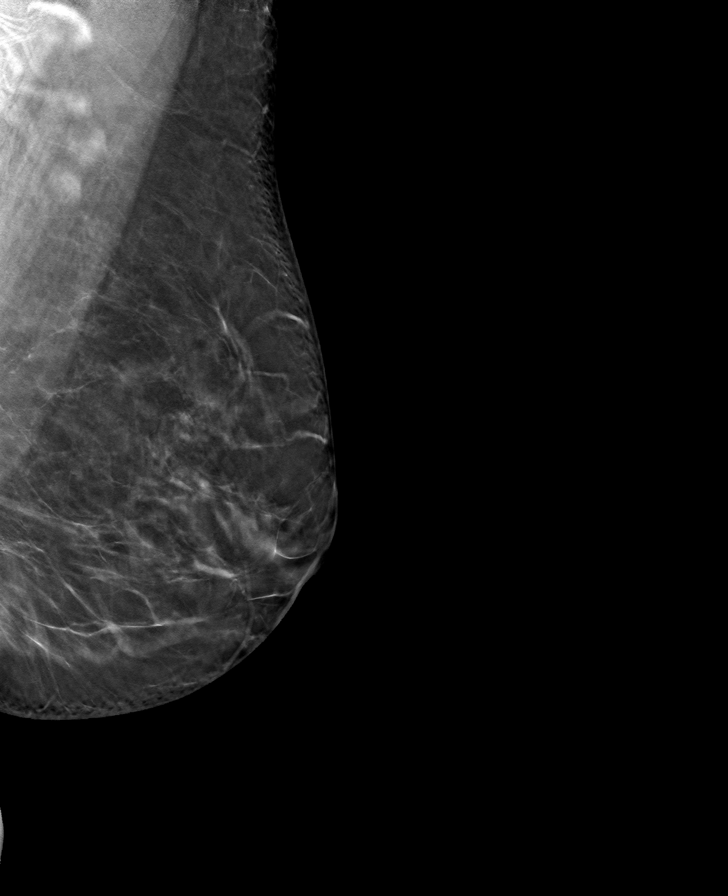

[L CC tomo · tomo slice 35/69.0]
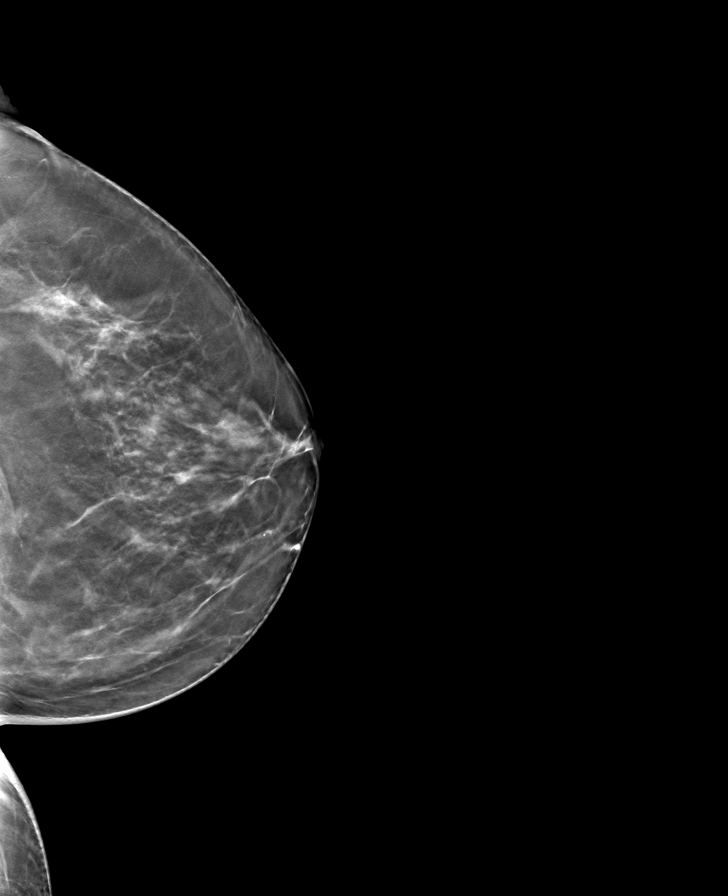

[8 of 24 positions shown; findings below may reference images not displayed]

ACR Breast Density Category b: There are scattered areas of
fibroglandular density.
FINDINGS: There are no findings suspicious for malignancy.
IMPRESSION: No mammographic evidence of malignancy. A result letter of this
screening mammogram will be mailed directly to the patient.

RECOMMENDATION:
Screening mammogram in one year. (Code:51-O-LD2)

BI-RADS CATEGORY  1: Negative.
